# Patient Record
Sex: Male | Born: 1970 | Race: White | Hispanic: No | Marital: Single
Health system: Southern US, Community
[De-identification: ages and names within clinical notes are randomized; demographics above are authoritative.]

---

## 2003-11-12 ENCOUNTER — Emergency Department (HOSPITAL_COMMUNITY): Admission: EM | Admit: 2003-11-12 | Discharge: 2003-11-12 | Payer: Self-pay | Admitting: Emergency Medicine

## 2003-11-17 ENCOUNTER — Emergency Department (HOSPITAL_COMMUNITY): Admission: EM | Admit: 2003-11-17 | Discharge: 2003-11-17 | Payer: Self-pay | Admitting: Emergency Medicine

## 2003-11-17 ENCOUNTER — Emergency Department (HOSPITAL_COMMUNITY): Admission: AD | Admit: 2003-11-17 | Discharge: 2003-11-17 | Payer: Self-pay | Admitting: Family Medicine

## 2004-05-28 ENCOUNTER — Emergency Department (HOSPITAL_COMMUNITY): Admission: EM | Admit: 2004-05-28 | Discharge: 2004-05-28 | Payer: Self-pay | Admitting: Emergency Medicine

## 2004-05-28 ENCOUNTER — Emergency Department (HOSPITAL_COMMUNITY): Admission: EM | Admit: 2004-05-28 | Discharge: 2004-05-28 | Payer: Self-pay | Admitting: Family Medicine

## 2005-04-09 ENCOUNTER — Emergency Department (HOSPITAL_COMMUNITY): Admission: EM | Admit: 2005-04-09 | Discharge: 2005-04-09 | Payer: Self-pay | Admitting: Emergency Medicine

## 2005-12-06 ENCOUNTER — Emergency Department (HOSPITAL_COMMUNITY): Admission: EM | Admit: 2005-12-06 | Discharge: 2005-12-06 | Payer: Self-pay | Admitting: Emergency Medicine

## 2006-01-31 ENCOUNTER — Observation Stay (HOSPITAL_COMMUNITY): Admission: EM | Admit: 2006-01-31 | Discharge: 2006-01-31 | Payer: Self-pay | Admitting: Emergency Medicine

## 2006-02-02 ENCOUNTER — Emergency Department (HOSPITAL_COMMUNITY): Admission: EM | Admit: 2006-02-02 | Discharge: 2006-02-02 | Payer: Self-pay | Admitting: Emergency Medicine

## 2006-02-03 ENCOUNTER — Emergency Department (HOSPITAL_COMMUNITY): Admission: EM | Admit: 2006-02-03 | Discharge: 2006-02-03 | Payer: Self-pay | Admitting: Emergency Medicine

## 2006-02-25 ENCOUNTER — Ambulatory Visit: Payer: Self-pay | Admitting: *Deleted

## 2006-02-26 ENCOUNTER — Ambulatory Visit (HOSPITAL_COMMUNITY): Admission: RE | Admit: 2006-02-26 | Discharge: 2006-02-26 | Payer: Self-pay | Admitting: Otolaryngology

## 2006-03-03 ENCOUNTER — Ambulatory Visit (HOSPITAL_BASED_OUTPATIENT_CLINIC_OR_DEPARTMENT_OTHER): Admission: RE | Admit: 2006-03-03 | Discharge: 2006-03-03 | Payer: Self-pay | Admitting: Surgery

## 2006-03-11 ENCOUNTER — Emergency Department (HOSPITAL_COMMUNITY): Admission: EM | Admit: 2006-03-11 | Discharge: 2006-03-11 | Payer: Self-pay | Admitting: Emergency Medicine

## 2006-12-26 IMAGING — CT CT MAXILLOFACIAL W/O CM
1 of 2 series · 15 of 30 positions shown, 19 images · non-contrast
Comparison: 01/30/06.
 MAXILLOFACIAL CT WITHOUT CONTRAST:

CLINICAL DATA: Recent jaw surgery for fracture.  Continued pain and swelling.
TECHNIQUE: Axial and coronal CT imaging was performed through the maxillofacial structures.  No intravenous contrast was administered.

[Series 2: supine facial bones · axial · 0.33mm/px · z∈[+29,+169]mm · 15 of 66 slices shown, 19 images]
[im 5/66  brain]
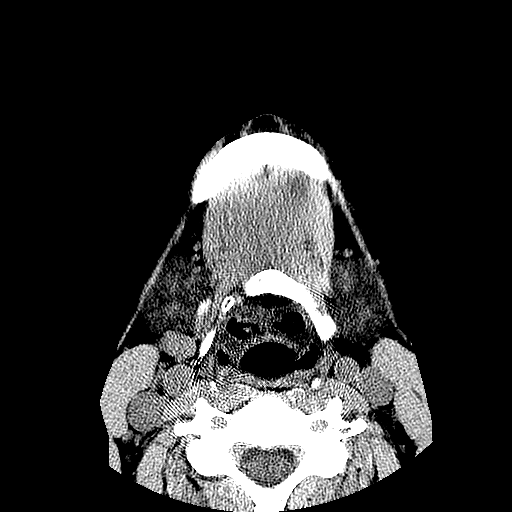
[im 5/66  bone]
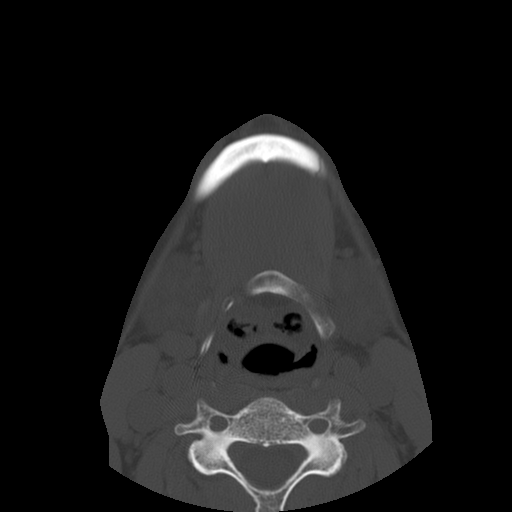
[im 9/66  bone]
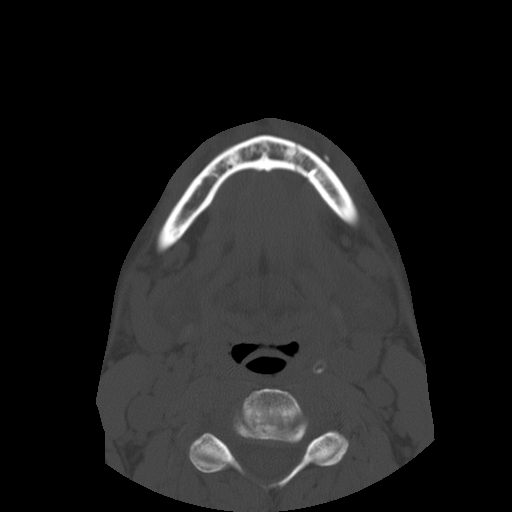
[im 13/66  bone]
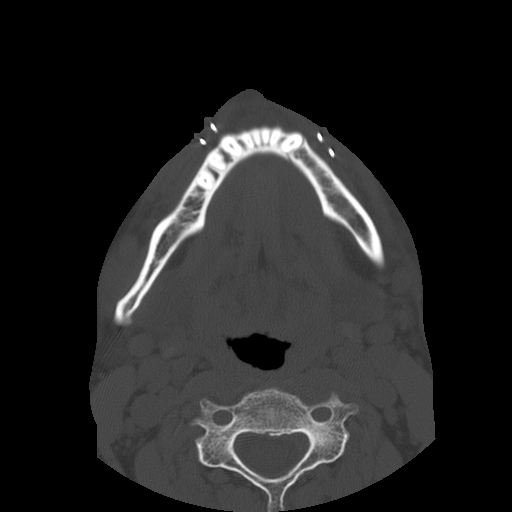
[im 17/66  bone]
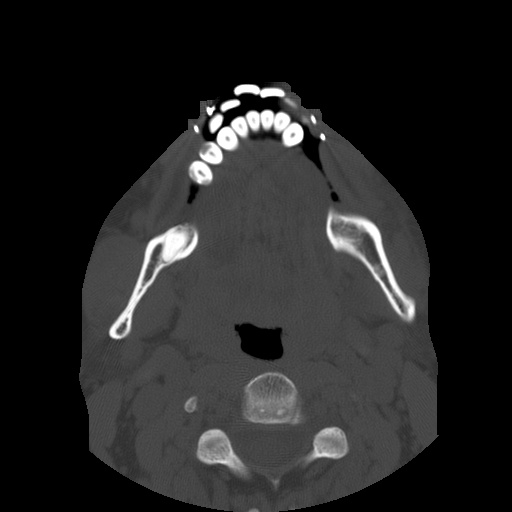
[im 21/66  brain]
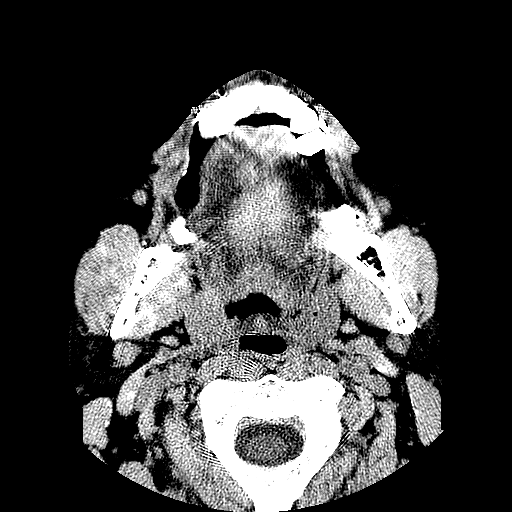
[im 21/66  bone]
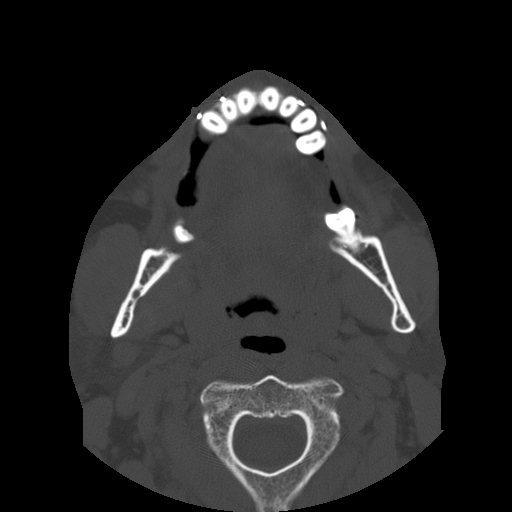
[im 25/66  bone]
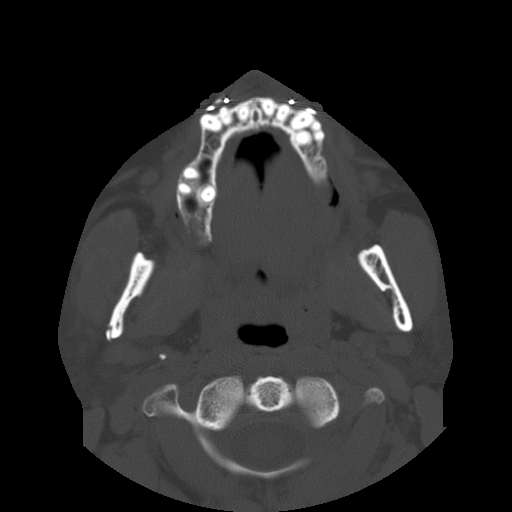
[im 29/66  bone]
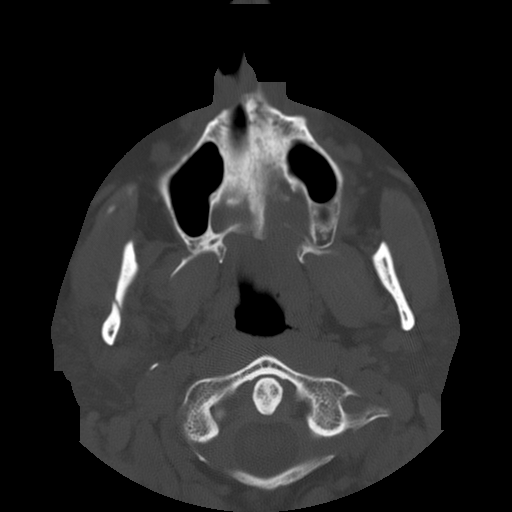
[im 33/66  bone]
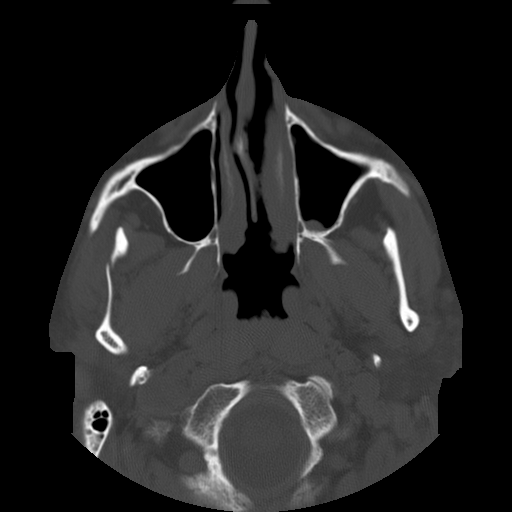
[im 37/66  brain]
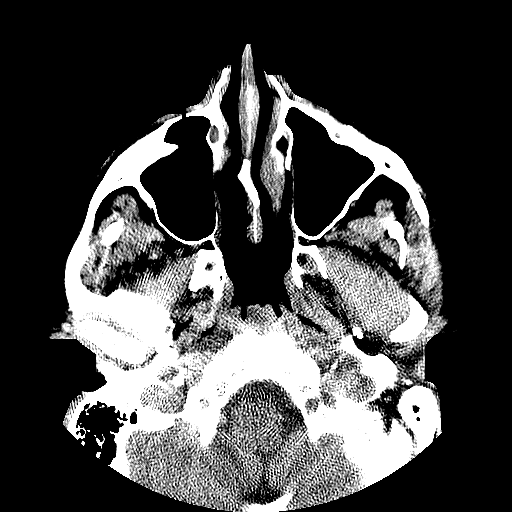
[im 37/66  bone]
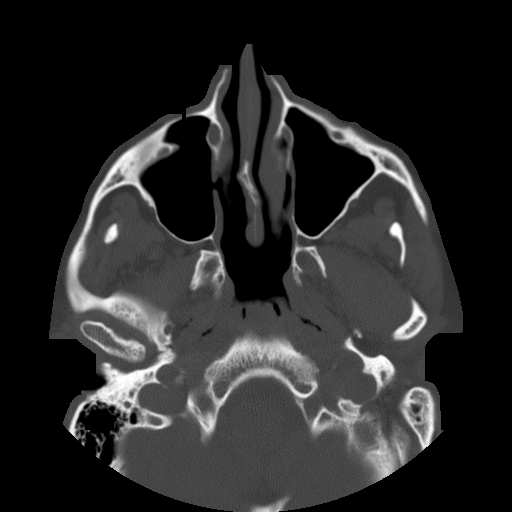
[im 41/66  bone]
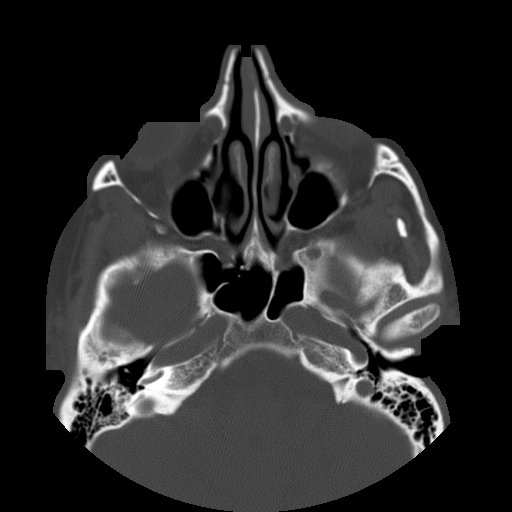
[im 45/66  bone]
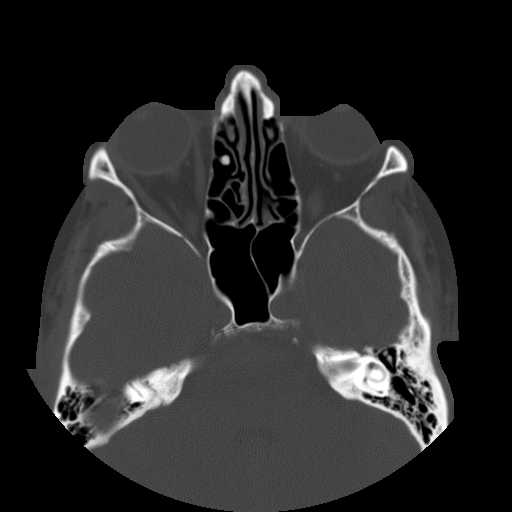
[im 49/66  bone]
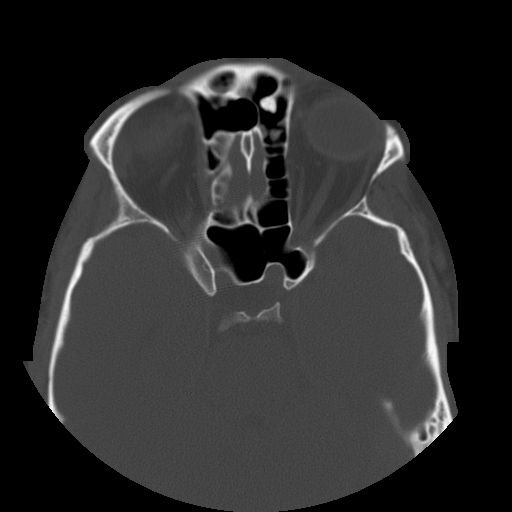
[im 53/66  brain]
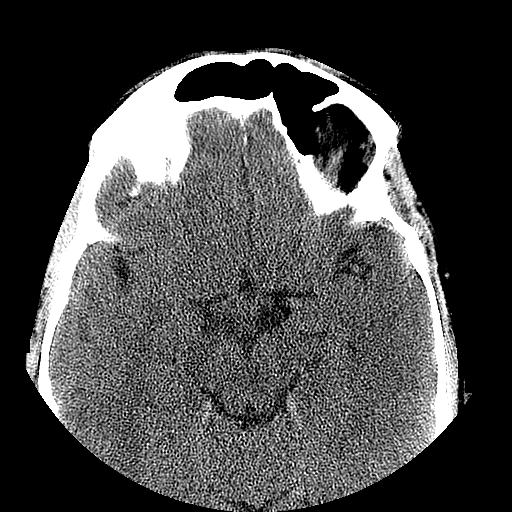
[im 53/66  bone]
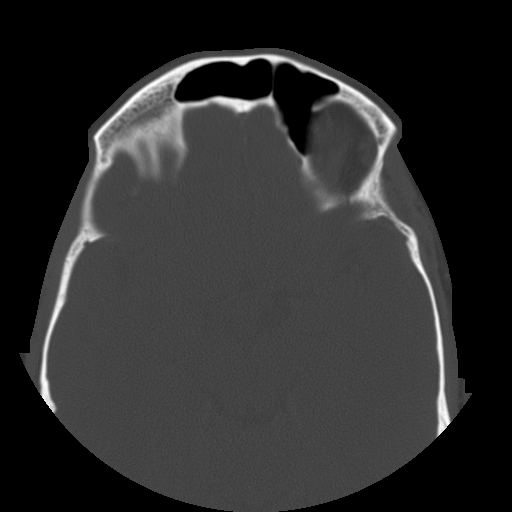
[im 57/66  bone]
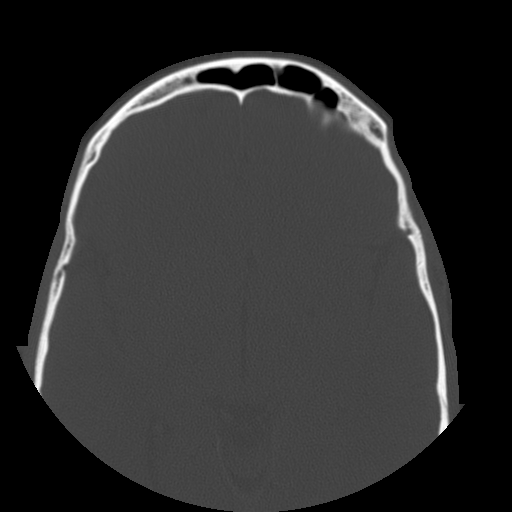
[im 61/66  bone]
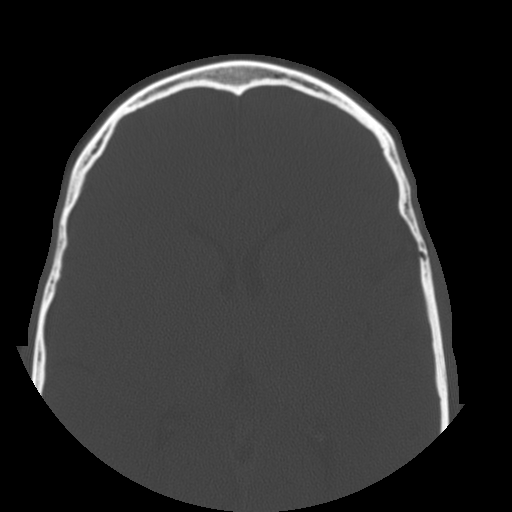

[15 of 30 positions shown; findings below may reference images not displayed]

FINDINGS: The patient has undergone dental occlusion.  Left parasymphyseal minimally   displaced fracture and right subcondylar mandibular fracture are again noted.  No additional fractures.  There are small lymph nodes in the neck.  No abnormal fluid collections.  A retention cyst or polyp is seen in the left maxillary sinus.  No air-fluid levels.  Mandibular condyles are located.
IMPRESSION: 1.  Status post dental occlusion for left parasymphyseal and right subcondylar mandibular fractures.

## 2008-10-11 ENCOUNTER — Emergency Department (HOSPITAL_COMMUNITY): Admission: EM | Admit: 2008-10-11 | Discharge: 2008-10-11 | Payer: Self-pay | Admitting: Emergency Medicine

## 2009-01-12 ENCOUNTER — Emergency Department (HOSPITAL_COMMUNITY): Admission: EM | Admit: 2009-01-12 | Discharge: 2009-01-12 | Payer: Self-pay | Admitting: Emergency Medicine

## 2009-01-13 ENCOUNTER — Emergency Department (HOSPITAL_COMMUNITY): Admission: EM | Admit: 2009-01-13 | Discharge: 2009-01-13 | Payer: Self-pay | Admitting: Emergency Medicine

## 2009-03-10 ENCOUNTER — Emergency Department (HOSPITAL_COMMUNITY): Admission: EM | Admit: 2009-03-10 | Discharge: 2009-03-10 | Payer: Self-pay | Admitting: Family Medicine

## 2009-03-12 ENCOUNTER — Emergency Department (HOSPITAL_COMMUNITY): Admission: EM | Admit: 2009-03-12 | Discharge: 2009-03-12 | Payer: Self-pay | Admitting: Family Medicine

## 2009-05-16 ENCOUNTER — Emergency Department (HOSPITAL_COMMUNITY): Admission: EM | Admit: 2009-05-16 | Discharge: 2009-05-16 | Payer: Self-pay | Admitting: Family Medicine

## 2010-01-18 ENCOUNTER — Emergency Department (HOSPITAL_COMMUNITY)
Admission: EM | Admit: 2010-01-18 | Discharge: 2010-01-18 | Payer: Self-pay | Source: Home / Self Care | Admitting: Emergency Medicine

## 2010-12-21 ENCOUNTER — Emergency Department (HOSPITAL_COMMUNITY)
Admission: EM | Admit: 2010-12-21 | Discharge: 2010-12-21 | Payer: Self-pay | Source: Home / Self Care | Admitting: Emergency Medicine

## 2010-12-28 ENCOUNTER — Emergency Department (HOSPITAL_COMMUNITY)
Admission: EM | Admit: 2010-12-28 | Discharge: 2010-12-28 | Payer: Self-pay | Source: Home / Self Care | Admitting: Emergency Medicine

## 2010-12-31 LAB — URIC ACID: Uric Acid, Serum: 4.5 mg/dL (ref 4.0–7.8)

## 2011-01-05 ENCOUNTER — Ambulatory Visit (HOSPITAL_COMMUNITY)
Admission: RE | Admit: 2011-01-05 | Discharge: 2011-01-05 | Payer: Self-pay | Source: Home / Self Care | Attending: Specialist | Admitting: Specialist

## 2011-01-20 ENCOUNTER — Emergency Department (HOSPITAL_COMMUNITY)
Admission: EM | Admit: 2011-01-20 | Discharge: 2011-01-20 | Disposition: A | Discharge status: Home / Self Care | Payer: No Typology Code available for payment source | Attending: Emergency Medicine | Admitting: Emergency Medicine

## 2011-01-20 ENCOUNTER — Emergency Department (HOSPITAL_COMMUNITY): Payer: No Typology Code available for payment source

## 2011-01-20 DIAGNOSIS — M25569 Pain in unspecified knee: Secondary | ICD-10-CM | POA: Insufficient documentation

## 2011-01-20 DIAGNOSIS — M79609 Pain in unspecified limb: Secondary | ICD-10-CM | POA: Insufficient documentation

## 2011-02-28 ENCOUNTER — Ambulatory Visit (HOSPITAL_COMMUNITY)
Admission: RE | Admit: 2011-02-28 | Discharge: 2011-02-28 | Disposition: A | Discharge status: Home / Self Care | Payer: No Typology Code available for payment source | Source: Ambulatory Visit | Attending: Specialist | Admitting: Specialist

## 2011-02-28 ENCOUNTER — Encounter (HOSPITAL_COMMUNITY)
Admission: RE | Admit: 2011-02-28 | Discharge: 2011-02-28 | Disposition: A | Discharge status: Home / Self Care | Payer: No Typology Code available for payment source | Source: Ambulatory Visit | Attending: Specialist | Admitting: Specialist

## 2011-02-28 ENCOUNTER — Other Ambulatory Visit (HOSPITAL_COMMUNITY): Payer: Self-pay | Admitting: Specialist

## 2011-02-28 DIAGNOSIS — Z01812 Encounter for preprocedural laboratory examination: Secondary | ICD-10-CM | POA: Insufficient documentation

## 2011-02-28 DIAGNOSIS — Z87891 Personal history of nicotine dependence: Secondary | ICD-10-CM | POA: Insufficient documentation

## 2011-02-28 DIAGNOSIS — M48061 Spinal stenosis, lumbar region without neurogenic claudication: Secondary | ICD-10-CM

## 2011-02-28 DIAGNOSIS — M5126 Other intervertebral disc displacement, lumbar region: Secondary | ICD-10-CM | POA: Insufficient documentation

## 2011-02-28 DIAGNOSIS — Z01818 Encounter for other preprocedural examination: Secondary | ICD-10-CM | POA: Insufficient documentation

## 2011-02-28 DIAGNOSIS — Z01810 Encounter for preprocedural cardiovascular examination: Secondary | ICD-10-CM | POA: Insufficient documentation

## 2011-02-28 LAB — DIFFERENTIAL
Basophils Absolute: 0 10*3/uL (ref 0.0–0.1)
Lymphocytes Relative: 33 % (ref 12–46)
Lymphs Abs: 2.7 10*3/uL (ref 0.7–4.0)
Monocytes Absolute: 0.5 10*3/uL (ref 0.1–1.0)
Neutro Abs: 4.6 10*3/uL (ref 1.7–7.7)

## 2011-02-28 LAB — COMPREHENSIVE METABOLIC PANEL
ALT: 27 U/L (ref 0–53)
AST: 24 U/L (ref 0–37)
Albumin: 3.8 g/dL (ref 3.5–5.2)
Alkaline Phosphatase: 94 U/L (ref 39–117)
Chloride: 102 mEq/L (ref 96–112)
GFR calc Af Amer: >60 mL/min (ref >60)
Potassium: 4.6 mEq/L (ref 3.5–5.1)
Total Bilirubin: 0.4 mg/dL (ref 0.3–1.2)

## 2011-02-28 LAB — CBC
HCT: 40 % (ref 39.0–52.0)
Hemoglobin: 13 g/dL (ref 13.0–17.0)
MCV: 83.2 fL (ref 78.0–100.0)
WBC: 8.1 10*3/uL (ref 4.0–10.5)

## 2011-02-28 LAB — PROTIME-INR: INR: 0.91 (ref 0.00–1.49)

## 2011-03-04 ENCOUNTER — Observation Stay (HOSPITAL_COMMUNITY)
Admission: RE | Admit: 2011-03-04 | Discharge: 2011-03-06 | Disposition: A | Discharge status: Home / Self Care | Payer: No Typology Code available for payment source | Source: Ambulatory Visit | Attending: Specialist | Admitting: Specialist

## 2011-03-04 ENCOUNTER — Ambulatory Visit (HOSPITAL_COMMUNITY): Payer: No Typology Code available for payment source

## 2011-03-04 DIAGNOSIS — F17200 Nicotine dependence, unspecified, uncomplicated: Secondary | ICD-10-CM | POA: Insufficient documentation

## 2011-03-04 DIAGNOSIS — M51379 Other intervertebral disc degeneration, lumbosacral region without mention of lumbar back pain or lower extremity pain: Secondary | ICD-10-CM | POA: Insufficient documentation

## 2011-03-04 DIAGNOSIS — M5137 Other intervertebral disc degeneration, lumbosacral region: Secondary | ICD-10-CM | POA: Insufficient documentation

## 2011-03-04 DIAGNOSIS — F411 Generalized anxiety disorder: Secondary | ICD-10-CM | POA: Insufficient documentation

## 2011-03-04 DIAGNOSIS — Z01812 Encounter for preprocedural laboratory examination: Secondary | ICD-10-CM | POA: Insufficient documentation

## 2011-03-04 DIAGNOSIS — K219 Gastro-esophageal reflux disease without esophagitis: Secondary | ICD-10-CM | POA: Insufficient documentation

## 2011-03-04 DIAGNOSIS — M5126 Other intervertebral disc displacement, lumbar region: Principal | ICD-10-CM | POA: Insufficient documentation

## 2011-03-04 DIAGNOSIS — Z01818 Encounter for other preprocedural examination: Secondary | ICD-10-CM | POA: Insufficient documentation
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
## 2011-03-28 ENCOUNTER — Other Ambulatory Visit (HOSPITAL_COMMUNITY): Payer: Self-pay | Admitting: Specialist

## 2011-03-28 DIAGNOSIS — M2548 Effusion, other site: Secondary | ICD-10-CM

## 2011-04-01 ENCOUNTER — Ambulatory Visit (HOSPITAL_COMMUNITY)
Admission: RE | Admit: 2011-04-01 | Discharge: 2011-04-01 | Disposition: A | Discharge status: Home / Self Care | Payer: No Typology Code available for payment source | Source: Ambulatory Visit | Attending: Specialist | Admitting: Specialist

## 2011-04-01 DIAGNOSIS — M25469 Effusion, unspecified knee: Secondary | ICD-10-CM | POA: Insufficient documentation

## 2011-04-01 DIAGNOSIS — M6281 Muscle weakness (generalized): Secondary | ICD-10-CM | POA: Insufficient documentation

## 2011-04-01 DIAGNOSIS — M7120 Synovial cyst of popliteal space [Baker], unspecified knee: Secondary | ICD-10-CM | POA: Insufficient documentation

## 2011-04-01 DIAGNOSIS — M2240 Chondromalacia patellae, unspecified knee: Secondary | ICD-10-CM | POA: Insufficient documentation

## 2011-04-01 DIAGNOSIS — M2548 Effusion, other site: Secondary | ICD-10-CM
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
## 2011-05-31 ENCOUNTER — Emergency Department (HOSPITAL_COMMUNITY)
Admission: EM | Admit: 2011-05-31 | Discharge: 2011-05-31 | Disposition: A | Discharge status: Home / Self Care | Payer: Self-pay | Attending: Emergency Medicine | Admitting: Emergency Medicine

## 2011-05-31 DIAGNOSIS — R5381 Other malaise: Secondary | ICD-10-CM | POA: Insufficient documentation

## 2011-05-31 DIAGNOSIS — R5383 Other fatigue: Secondary | ICD-10-CM | POA: Insufficient documentation

## 2011-05-31 DIAGNOSIS — M549 Dorsalgia, unspecified: Secondary | ICD-10-CM | POA: Insufficient documentation

## 2011-05-31 DIAGNOSIS — R110 Nausea: Secondary | ICD-10-CM | POA: Insufficient documentation

## 2011-05-31 DIAGNOSIS — G8929 Other chronic pain: Secondary | ICD-10-CM | POA: Insufficient documentation

## 2011-05-31 DIAGNOSIS — Z79899 Other long term (current) drug therapy: Secondary | ICD-10-CM | POA: Insufficient documentation

## 2011-11-20 IMAGING — CR DG RIBS W/ CHEST 3+V*R*
1 series · 1 of 1 positions shown · non-contrast
Comparison: None

CLINICAL DATA: Foot pain.  Posterior right rib pain.  Back pain.

RIGHT RIBS AND CHEST - 3+ VIEW

[t chest supine]
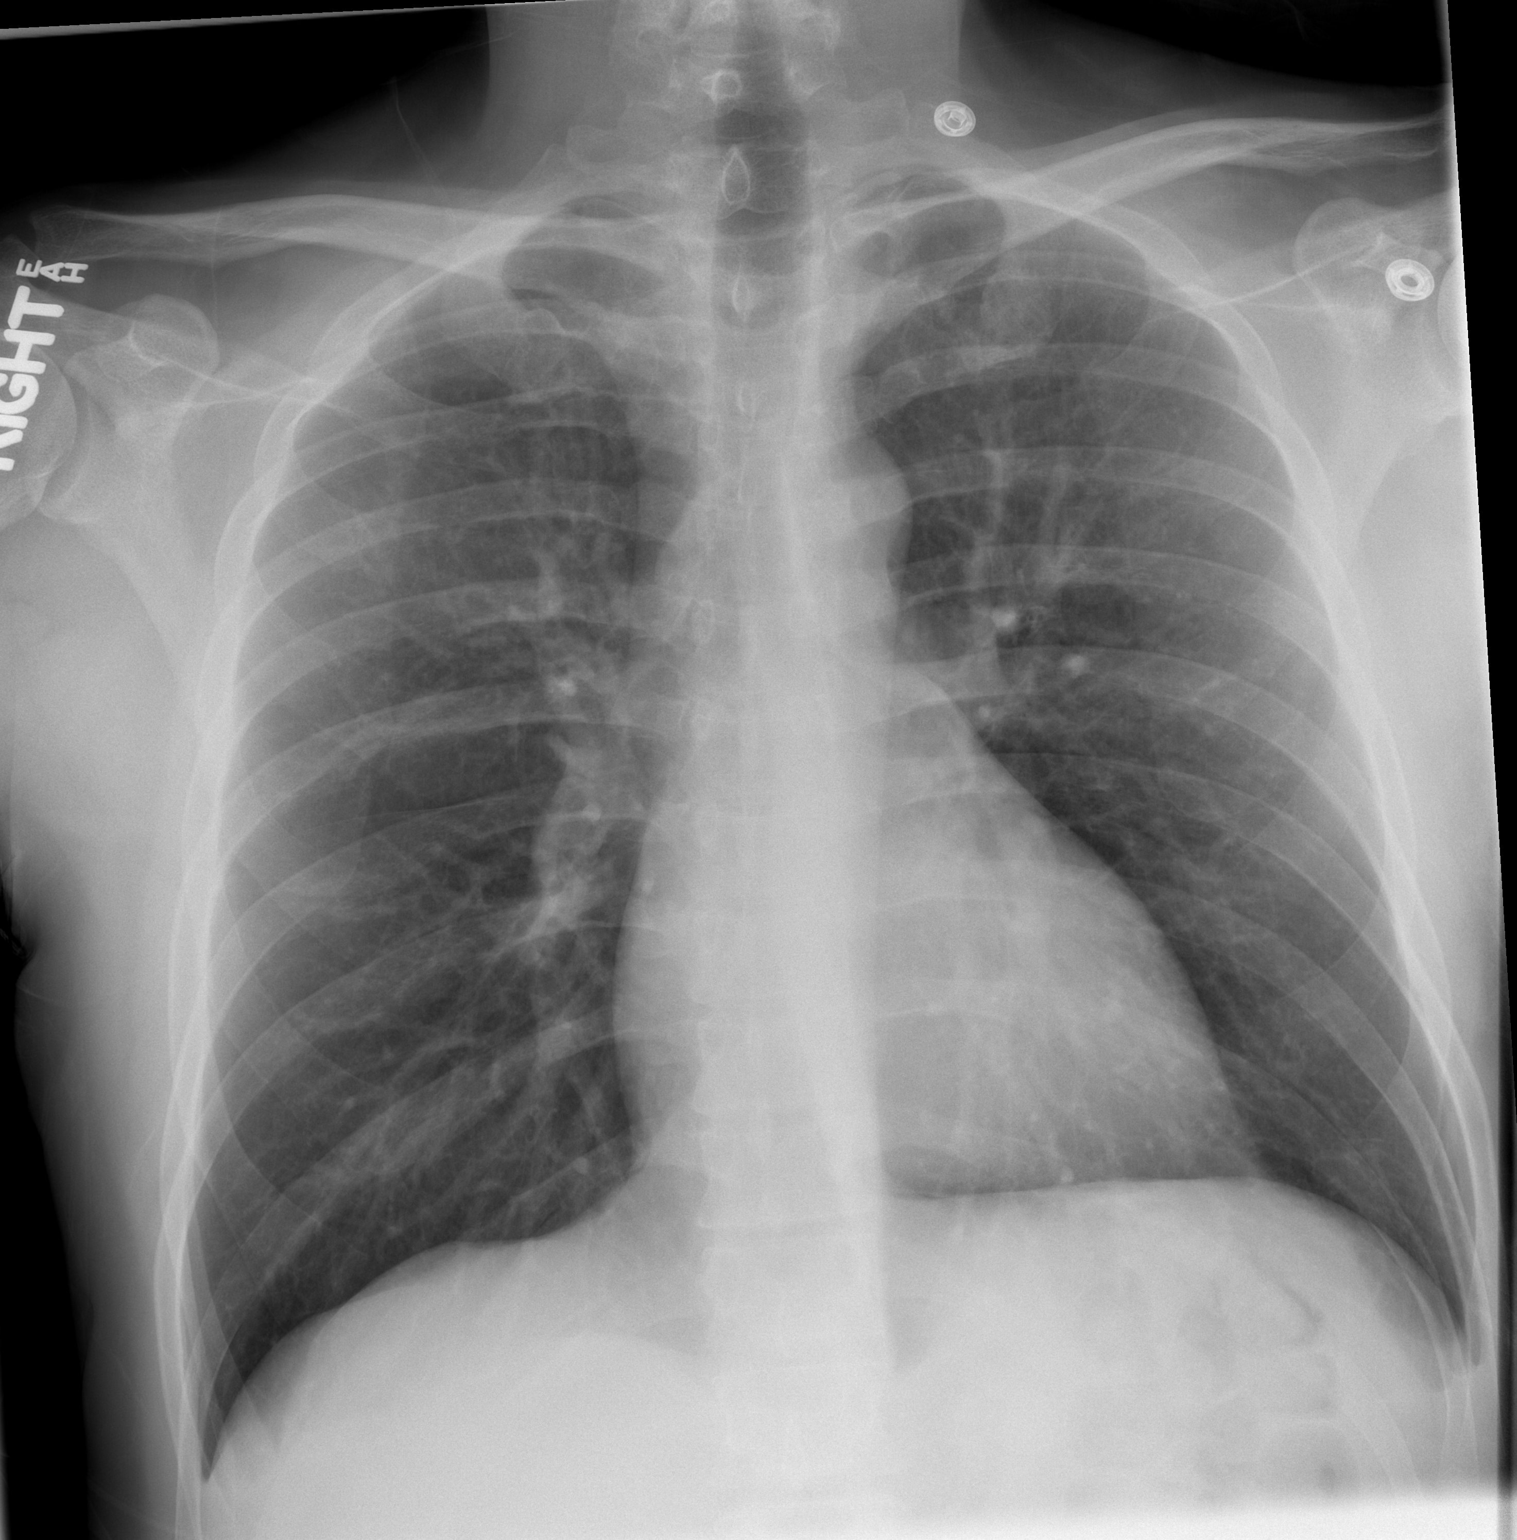

[1 of 1 positions shown; findings below may reference images not displayed]

FINDINGS: Cardiomediastinal silhouette is within normal limits.
The lungs are free of focal consolidations and pleural effusions.
Oblique views of the ribs show no acute fracture.  No evidence for
pneumothorax.  No evidence for vertebral fracture.
IMPRESSION: Negative exam.

## 2015-11-22 DIAGNOSIS — R634 Abnormal weight loss: Secondary | ICD-10-CM | POA: Insufficient documentation

## 2015-11-22 DIAGNOSIS — R002 Palpitations: Secondary | ICD-10-CM | POA: Insufficient documentation

## 2016-01-29 DIAGNOSIS — K009 Disorder of tooth development, unspecified: Secondary | ICD-10-CM

## 2016-01-29 NOTE — Congregational Nurse Program (Signed)
Congregational Nurse Program Note  Date of Encounter: 01/29/2016  Past Medical History: No past medical history on file.  Encounter Details:     CNP Questionnaire - 01/29/16 2353    Patient Demographics   Is this a new or existing patient? New   Patient is considered a/an Not Applicable   Race Caucasian/White   Patient Assistance   Location of Patient Assistance Not Applicable   Patient's financial/insurance status Medicaid   Uninsured Patient No   Patient referred to apply for the following financial assistance Not Applicable   Food insecurities addressed Not Applicable   Transportation assistance No   Assistance securing medications No   Educational health offerings Navigating the healthcare system   Encounter Details   Primary purpose of visit Education/Health Concerns;Spiritual Care/Support Visit;Navigating the Healthcare System   Was an Emergency Department visit averted? Not Applicable   Does patient have a medical provider? Yes   Patient referred to Follow up with established PCP   Was a mental health screening completed? (GAINS tool) No   Does patient have dental issues? Yes   Was a dental referral made? Yes   Does patient have vision issues? No   Since previous encounter, have you referred patient for abnormal blood pressure that resulted in a new diagnosis or medication change? No   Since previous encounter, have you referred patient for abnormal blood glucose that resulted in a new diagnosis or medication change? No   For Abstraction Use Only   Does patient have insurance? Yes     Client 's initial visit with nurse. In with his 69 year old  daughter . States I really need assistance with dental care . Nurse gave  client  The name of a dentist taking medicaid ,if he is  successful client needs to let  Nurse  Know  Or return for more assistance. This  dentist has done a presentation at  [facility] and is willing to  take clients. States  He has  A PCP  Dr. Jaci Lazier . Has been at  [facility] for 1 1/2 weeks , has transportation . Client was thankful  For assistance.

## 2016-02-13 DIAGNOSIS — K009 Disorder of tooth development, unspecified: Secondary | ICD-10-CM

## 2016-02-13 NOTE — Congregational Nurse Program (Signed)
Congregational Nurse Program Note  Date of Encounter: 02/13/2016  Past Medical History: No past medical history on file.  Encounter Details:     CNP Questionnaire - 02/13/16 1838    Patient Demographics   Is this a new or existing patient? Existing   Patient is considered a/an Not Applicable   Race Caucasian/White   Patient Assistance   Location of Patient Assistance Not Applicable   Patient's financial/insurance status Medicaid   Uninsured Patient No   Patient referred to apply for the following financial assistance Not Applicable   Food insecurities addressed Not Applicable   Transportation assistance No   Assistance securing medications No   Encounter Details   Primary purpose of visit Acute Illness/Condition Visit;Spiritual Care/Support Visit   Was an Emergency Department visit averted? Not Applicable   Does patient have a medical provider? Yes   Patient referred to Other (comment)   Was a mental health screening completed? (GAINS tool) No   Does patient have dental issues? No   Was a dental referral made? Yes   Does patient have vision issues? No   Since previous encounter, have you referred patient for abnormal blood pressure that resulted in a new diagnosis or medication change? No   Since previous encounter, have you referred patient for abnormal blood glucose that resulted in a new diagnosis or medication change? No   For Abstraction Use Only   Does patient have insurance? Yes    Client has  Not  Received a call back  For his  Dental needs . Nurse will try  Another provider and see if she  Can make the appointment. Client states he needs  Some  Teeth pulled  And is in pain. Client works  But  Is  Now  Concerned he lost his  Day  Care today  Due to  Funding  And isn't sure  What  He is going to  Be able to do . Has an appointment with his  Case manager to see what  Can be worked out.  States things  Just  Get harder. Nurse offered support and listened to client as he   Talked  About  Caring for  Daughter and having  No support system  Here.  Nurse will call client about  Dental appointment  When made.

## 2016-02-19 ENCOUNTER — Telehealth: Payer: Self-pay

## 2016-02-19 NOTE — Telephone Encounter (Signed)
Nurse  Made  Contact  With  Dental office  ,needed  Clients  Medicaid  Number  , nurse  Called  Client times  2 and left a message.

## 2016-02-26 ENCOUNTER — Telehealth: Payer: Self-pay

## 2016-02-26 DIAGNOSIS — K009 Disorder of tooth development, unspecified: Secondary | ICD-10-CM

## 2016-02-26 NOTE — Telephone Encounter (Signed)
Nurse called to  Schedule  Clients  Dental appointment ,states he couldn't get  Through. Appointment made for client 03-25-16 @ 2 pm .  Will  Let  Client know  Of  Appointment and need to  Take medicaid  Card and co pay  Of  $3.00.

## 2016-02-26 NOTE — Congregational Nurse Program (Signed)
Congregational Nurse Program Note  Date of Encounter: 02/26/2016  Past Medical History: No past medical history on file.  Encounter Details:     CNP Questionnaire - 02/26/16 2241    Patient Demographics   Is this a new or existing patient? Existing   Patient is considered a/an Not Applicable   Race Caucasian/White   Patient Assistance   Location of Patient Assistance Not Applicable   Patient's financial/insurance status Medicaid   Uninsured Patient No   Patient referred to apply for the following financial assistance Not Applicable   Food insecurities addressed Not Applicable   Transportation assistance No   Assistance securing medications No   Educational health offerings Navigating the healthcare system   Encounter Details   Primary purpose of visit Acute Illness/Condition Visit;Education/Health Concerns   Was an Emergency Department visit averted? Not Applicable   Does patient have a medical provider? Yes   Patient referred to Area Agency   Was a mental health screening completed? (GAINS tool) No   Does patient have dental issues? No   Was a dental referral made? Yes   Does patient have vision issues? No   Since previous encounter, have you referred patient for abnormal blood pressure that resulted in a new diagnosis or medication change? No   Since previous encounter, have you referred patient for abnormal blood glucose that resulted in a new diagnosis or medication change? No   For Abstraction Use Only   Does patient have insurance? Yes    Nurse  Was able to  Make  A dental appointment for client and told client appointment was made last week. Appointment made for 03-25-16 @ 2 pm  With  Dr. Laurann MontanaSharon Long- Stokes . Location and phone  Number given to  Client. He is to arrive  30 minutes in advance of appointment.  Note  Left in mail box .   Patient:   Procedure(s)   Anesthesia type  Post pain:   Post assessment:   Last Vitals:   Post vital signs:   Level of  consciousness:   Complications:

## 2016-03-11 DIAGNOSIS — K009 Disorder of tooth development, unspecified: Secondary | ICD-10-CM

## 2016-03-11 NOTE — Congregational Nurse Program (Signed)
Congregational Nurse Program Note  Date of Encounter: 03/11/2016  Past Medical History: No past medical history on file.  Encounter Details:     CNP Questionnaire - 03/11/16 1856    Patient Demographics   Is this a new or existing patient? Existing   Patient is considered a/an Not Applicable   Race Caucasian/White   Patient Assistance   Location of Patient Assistance Not Applicable   Patient's financial/insurance status Medicaid   Uninsured Patient No   Patient referred to apply for the following financial assistance Medicaid   Food insecurities addressed Not Applicable   Transportation assistance No   Assistance securing medications No   Educational health offerings Acute disease;Navigating the healthcare system   Encounter Details   Primary purpose of visit Education/Health Concerns;Acute Illness/Condition Visit   Was an Emergency Department visit averted? Not Applicable   Does patient have a medical provider? Yes   Patient referred to Area Agency   Was a mental health screening completed? (GAINS tool) No   Does patient have dental issues? No   Was a dental referral made? Yes   Does patient have vision issues? No   Since previous encounter, have you referred patient for abnormal blood pressure that resulted in a new diagnosis or medication change? No   Since previous encounter, have you referred patient for abnormal blood glucose that resulted in a new diagnosis or medication change? No   For Abstraction Use Only   Does patient have insurance? Yes    Client  Touching  Base  With  Nurse , states he is anxious  About  His  Dental appointment and will definitely  Be there. Ask if his  Medicaid  Card is  Current ,no  But  Plans to  Go  To DSS to correct it  Before ndental appointment. Discussed the importance as dental office will want  A current card.  Appointment is 03-25-16 .  Follow  As needed.

## 2016-03-25 DIAGNOSIS — K009 Disorder of tooth development, unspecified: Secondary | ICD-10-CM

## 2016-03-25 NOTE — Congregational Nurse Program (Signed)
Congregational Nurse Program Note  Date of Encounter: 03/25/2016  Past Medical History: No past medical history on file.  Encounter Details:     CNP Questionnaire - 03/25/16 2246    Patient Demographics   Is this a new or existing patient? Existing   Patient is considered a/an Not Applicable   Race Caucasian/White   Patient Assistance   Location of Patient Assistance Not Applicable   Patient's financial/insurance status Medicaid   Uninsured Patient No   Patient referred to apply for the following financial assistance Not Applicable   Encounter Details   Was an Emergency Department visit averted? Not Applicable   Does patient have a medical provider? Yes   Patient referred to Area Agency;Private Practice   Was a mental health screening completed? (GAINS tool) No   Does patient have dental issues? Yes   Was a dental referral made? Yes   Does patient have vision issues? No   Since previous encounter, have you referred patient for abnormal blood pressure that resulted in a new diagnosis or medication change? No   Since previous encounter, have you referred patient for abnormal blood glucose that resulted in a new diagnosis or medication change? No   For Abstraction Use Only   Does patient have insurance? Yes     Client had  His dental appointment  Today  And was in to  Thank the  Nurse  For her referral . States he  Has some infection and the dentist  Gave him two  Prescriptions that he will take  And then return and his top teeth will be pulled. He  Was relieved that he has finally been seen for dental pain that he had learned to cope with. Stated MD was very nice. Care under way .  Follow  As needed.

## 2016-05-27 DIAGNOSIS — Z5900 Homelessness unspecified: Secondary | ICD-10-CM

## 2016-05-27 DIAGNOSIS — G43519 Persistent migraine aura without cerebral infarction, intractable, without status migrainosus: Secondary | ICD-10-CM

## 2016-05-28 DIAGNOSIS — Z5900 Homelessness unspecified: Secondary | ICD-10-CM

## 2016-05-28 NOTE — Congregational Nurse Program (Signed)
Congregational Nurse Program Note  Date of Encounter: 05/28/2016  Past Medical History: No past medical history on file.  Encounter Details:     CNP Questionnaire - 05/28/16 1746    Patient Demographics   Is this a new or existing patient? Existing   Patient is considered a/an Not Applicable   Race Caucasian/White   Patient Assistance   Location of Patient Assistance Not Applicable   Patient's financial/insurance status Medicaid   Uninsured Patient No   Patient referred to apply for the following financial assistance Not Applicable   Food insecurities addressed Not Applicable   Transportation assistance No   Assistance securing medications No   Educational health offerings Spiritual care;Interpersonal relationships   Encounter Details   Primary purpose of visit Spiritual Care/Support Visit;Education/Health Concerns   Was an Emergency Department visit averted? Not Applicable   Does patient have a medical provider? Yes   Patient referred to Not Applicable   Was a mental health screening completed? (GAINS tool) No   Does patient have dental issues? No   Does patient have vision issues? No   Does your patient have an abnormal blood pressure today? No   Since previous encounter, have you referred patient for abnormal blood pressure that resulted in a new diagnosis or medication change? No   Does your patient have an abnormal blood glucose today? No   Since previous encounter, have you referred patient for abnormal blood glucose that resulted in a new diagnosis or medication change? No   Was there a life-saving intervention made? No    Client in today  To  Thank  Nurse  For  Assisting him  On yesterday . States  Today  Has  Been a better day , he  Has  Accomplished a lot of things  , feeling  Much  Better,.  Working  On his  Statisticianhone  Issues . Case management stated  He  Completed  An  Application  For  Work  With  [employer] , housing application taken . Made a lot of  Progress   Today.

## 2016-05-28 NOTE — Congregational Nurse Program (Signed)
Congregational Nurse Program Note  Date of Encounter: 05/27/2016  Past Medical History: No past medical history on file.  Encounter Details:     CNP Questionnaire - 05/28/16 1735    Patient Demographics   Is this a new or existing patient? Existing   Patient is considered a/an Not Applicable   Race Caucasian/White   Patient Assistance   Location of Patient Assistance Not Applicable   Patient's financial/insurance status Medicaid   Uninsured Patient No   Patient referred to apply for the following financial assistance Not Applicable   Food insecurities addressed Not Applicable   Transportation assistance No   Assistance securing medications No   Educational health offerings Spiritual care;Acute disease   Encounter Details   Primary purpose of visit Acute Illness/Condition Visit;Spiritual Care/Support Visit;Education/Health Concerns   Was an Emergency Department visit averted? Not Applicable   Does patient have a medical provider? Yes   Patient referred to Not Applicable   Was a mental health screening completed? (GAINS tool) No   Does patient have dental issues? No   Does patient have vision issues? No   Does your patient have an abnormal blood pressure today? No   Since previous encounter, have you referred patient for abnormal blood pressure that resulted in a new diagnosis or medication change? No   Does your patient have an abnormal blood glucose today? No   Since previous encounter, have you referred patient for abnormal blood glucose that resulted in a new diagnosis or medication change? No   Was there a life-saving intervention made? No    Client in to see nurse  With  complaints  Of  Migraine headache . States he is under a lot  Of  Stress and his  Head is  Really  Hurting, needs help with  Making a few  Calls  As his  Phone isn't  Working , nurse allowed client to  Use her  Phone  And office  Phone to  Relieve  His  agitation and stress. Client has young  Daughter and  [facility]  [facility]  [facility]  [facility] and he is worried  About  Placement ,his  Temporary  Job  And  The next steps . Has no support  System  And is a recovering  Addict. 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 Nurse listened and offered support  And encouragement to  Client ,will speak with  Case management regarding his  Stress level  Today . Client was able to  Make  Calls  And  Accepted  Encouragement     To keep pushing  Himself  To  Get things  Done and things  Would  Work for him . Spoke later with  Case management  Regarding clients  Stress level . Case management  Working with  Client  And trying to assure placement for  Family  Unit goes well.

## 2021-03-19 DIAGNOSIS — E782 Mixed hyperlipidemia: Secondary | ICD-10-CM | POA: Insufficient documentation

## 2021-03-30 LAB — EXTERNAL GENERIC LAB PROCEDURE: COLOGUARD: NEGATIVE

## 2022-02-12 ENCOUNTER — Other Ambulatory Visit: Payer: Self-pay

## 2022-02-12 ENCOUNTER — Ambulatory Visit: Payer: Medicaid Other | Admitting: Podiatry

## 2022-02-17 ENCOUNTER — Ambulatory Visit: Payer: Medicaid Other | Admitting: Podiatry

## 2022-03-24 ENCOUNTER — Ambulatory Visit: Payer: Medicaid Other | Admitting: Podiatry

## 2022-03-28 ENCOUNTER — Ambulatory Visit: Payer: Medicaid Other | Admitting: Podiatry

## 2022-04-08 ENCOUNTER — Ambulatory Visit: Payer: Medicaid Other | Admitting: Podiatry

## 2022-04-22 ENCOUNTER — Ambulatory Visit (INDEPENDENT_AMBULATORY_CARE_PROVIDER_SITE_OTHER): Payer: Medicaid Other | Admitting: Podiatry

## 2022-04-22 DIAGNOSIS — B351 Tinea unguium: Secondary | ICD-10-CM

## 2022-04-22 DIAGNOSIS — G43009 Migraine without aura, not intractable, without status migrainosus: Secondary | ICD-10-CM | POA: Insufficient documentation

## 2022-04-22 DIAGNOSIS — F1111 Opioid abuse, in remission: Secondary | ICD-10-CM | POA: Insufficient documentation

## 2022-04-22 DIAGNOSIS — K219 Gastro-esophageal reflux disease without esophagitis: Secondary | ICD-10-CM | POA: Insufficient documentation

## 2022-04-22 DIAGNOSIS — F410 Panic disorder [episodic paroxysmal anxiety] without agoraphobia: Secondary | ICD-10-CM | POA: Insufficient documentation

## 2022-04-22 MED ORDER — CICLOPIROX 8 % EX SOLN: Freq: Every day | CUTANEOUS | 0 refills | Status: AC

## 2022-04-22 MED ORDER — FLUCONAZOLE 150 MG PO TABS: 150.0000 mg | ORAL_TABLET | ORAL | 0 refills | Status: AC

## 2022-04-27 ENCOUNTER — Encounter: Payer: Self-pay | Admitting: Podiatry

## 2022-04-27 NOTE — Progress Notes (Signed)
  Subjective:  Patient ID: Kurt Henderson, male    DOB: 1971/06/29,  MRN: [id number]  Chief Complaint  Patient presents with   Nail Problem    Thick painful toenails    51 y.o. male presents with the above complaint. History confirmed with patient.  He has discoloration and thickening of his great toes bilateral some other toes are affected but these are the most problematic ones  Objective:  Physical Exam: warm, good capillary refill, no trophic changes or ulcerative lesions, normal DP and PT pulses, normal sensory exam, and onychomycosis.     Assessment:   1. Onychomycosis      Plan:  Patient was evaluated and treated and all questions answered.  Discussed etiology and treatment options of onychomycosis including oral topical and laser treatment.  He is interested in getting rid of it as effectively as possible.  I recommended pulsed dosing with fluconazole and ciclopirox, he was concerned about having to take terbinafine due to possibility of liver issues.  I will see him back in 6 months for follow-up after this.  Photographs were taken  Return in about 6 months (around 10/23/2022) for follow up after nail fungus treatment.

## 2022-04-28 ENCOUNTER — Telehealth: Payer: Self-pay | Admitting: Podiatry

## 2022-04-28 NOTE — Telephone Encounter (Signed)
Pt called asking for nurse to call him he had a medical question that he refused to tell me any information/details  about.   Please advise

## 2022-10-23 ENCOUNTER — Ambulatory Visit (INDEPENDENT_AMBULATORY_CARE_PROVIDER_SITE_OTHER): Payer: Medicaid Other | Admitting: Podiatry

## 2022-10-23 DIAGNOSIS — Z91199 Patient's noncompliance with other medical treatment and regimen due to unspecified reason: Secondary | ICD-10-CM

## 2022-10-27 ENCOUNTER — Encounter: Payer: Self-pay | Admitting: Podiatry

## 2022-10-27 NOTE — Progress Notes (Signed)
Patient was no-show for appointment today 

## 2023-11-18 ENCOUNTER — Telehealth: Payer: Self-pay | Admitting: Physical Medicine and Rehabilitation

## 2023-11-18 ENCOUNTER — Ambulatory Visit: Payer: MEDICAID | Admitting: Physical Medicine and Rehabilitation

## 2023-11-18 DIAGNOSIS — M47819 Spondylosis without myelopathy or radiculopathy, site unspecified: Secondary | ICD-10-CM

## 2023-11-18 DIAGNOSIS — M47816 Spondylosis without myelopathy or radiculopathy, lumbar region: Secondary | ICD-10-CM | POA: Diagnosis not present

## 2023-11-18 DIAGNOSIS — M545 Low back pain, unspecified: Secondary | ICD-10-CM | POA: Diagnosis not present

## 2023-11-18 DIAGNOSIS — G8929 Other chronic pain: Secondary | ICD-10-CM

## 2023-11-18 MED ORDER — MELOXICAM 15 MG PO TABS: 15.0000 mg | ORAL_TABLET | Freq: Every day | ORAL | 0 refills | Status: AC

## 2023-11-18 NOTE — Telephone Encounter (Signed)
I called patient this afternoon, he verbalizes frustration as we are not able to view recent lumbar x-rays from another facility. I placed order for lumbar MRI imaging, he is unhappy this imaging can't be done today. I did write patient tor Meloxicam to take while we are waiting on lumbar MRI imaging. Should he require the use of narcotics, best option would be referral to chronic pain management.

## 2023-11-18 NOTE — Progress Notes (Signed)
Kurt Henderson - 52 y.o. male MRN [id number]  Date of birth: [birth year]  Office Visit Note: Visit Date: 11/18/2023 PCP: Milus Banister, MD Referred by: Milus Banister, MD  Subjective: Chief Complaint  Patient presents with   Lower Back - Pain   HPI: Kurt Henderson is a 52 y.o. male who comes in today per the request of Dr. Dartha Lodge for evaluation of chronic, worsening and severe bilateral lower back pain, right greater than left. Also reports numbness to left leg. Previous patient of Dr. Vira Browns. Pain ongoing for several years, worsened over the past several weeks. His pain worsens with movement and activity, specifically bending and when moving from sitting to standing position, he describes pain as constant sore and aching sensation, currently rates as 10 out of 10. Some relief of pain with home exercise regimen, heating pad, rest and use of medications. Some relief of pain with Ibuprofen and Goody powder. Patient states he had lumbar x-rays on Monday, I am not able to see these images in [facility]. He was a patient of Dr. Otelia Sergeant several years ago, history of lumbar injections several years ago with some relief of pain. Patient currently works as Chief Financial Officer for [employer]. Patient denies focal weakness. No recent trauma or falls.   Patients course is complicated by history of substance abuse, he is currently taking Methadone that is managed by [facility].      Review of Systems  Musculoskeletal:  Positive for back pain.  Neurological:  Positive for tingling. Negative for weakness.  All other systems reviewed and are negative.  Otherwise per HPI.  Assessment & Plan: Visit Diagnoses:    ICD-10-CM   1. Chronic bilateral low back pain without sciatica  M54.50 MR LUMBAR SPINE WO CONTRAST   G89.29     2. Spondylosis without myelopathy or radiculopathy  M47.819 MR LUMBAR SPINE WO CONTRAST    3. Facet arthropathy, lumbar  M47.816 MR LUMBAR SPINE WO CONTRAST       Plan: Findings:   Chronic, worsening and severe bilateral lower back pain, right greater than left. Intermittent paresthesias to left lower extremity. Patient continues to have severe pain despite good conservative therapies such as home exercise regimen, rest and use of medications. Patients clinical presentation and exam are consistent with facet mediated pain. He does have pain when moving from sitting to standing position and with lumbar extension. We discussed treatment plan in detail today, next step is to place order for lumbar MRI imaging. Depending on results of MRI imaging we discussed possibility of lumbar facet joint injections. Should his symptoms declare are more radicular in nature we would consider lumbar epidural steroid injection. I discussed medication management, instructed patient to stop Ibuprofen/Good powder and prescribed short course of Meloxicam. I also feel patient would benefit from re-grouping with formal physical therapy. I encouraged patient to continue being active as tolerated. I will see him back for lumbar MRI review. No red flag symptoms noted upon exam today.     Meds & Orders:  Meds ordered this encounter  Medications   meloxicam (MOBIC) 15 MG tablet    Sig: Take 1 tablet (15 mg total) by mouth daily.    Dispense:  30 tablet    Refill:  0    Orders Placed This Encounter  Procedures   MR LUMBAR SPINE WO CONTRAST    Follow-up: No follow-ups on file.   Procedures: No procedures performed      Clinical History: No specialty comments available.  He has no history on file for tobacco use. No results for input(s): "HGBA1C", "LABURIC" in the last 8760 hours.  Objective:  VS:  HT:    WT:   BMI:     BP:   HR: bpm  TEMP: ( )  RESP:  Physical Exam Vitals and nursing note reviewed.  HENT:     Head: Normocephalic and atraumatic.     Right Ear: External ear normal.     Left Ear: External ear normal.     Nose: Nose normal.     Mouth/Throat:     Mouth: Mucous membranes  are moist.  Eyes:     Extraocular Movements: Extraocular movements intact.  Cardiovascular:     Rate and Rhythm: Normal rate.     Pulses: Normal pulses.  Pulmonary:     Effort: Pulmonary effort is normal.  Abdominal:     General: Abdomen is flat. There is no distension.  Musculoskeletal:        General: Tenderness present.     Cervical back: Normal range of motion.     Comments: Patient rises from seated position to standing without difficulty. Pain noted with facet loading and lumbar extension. 5/5 strength noted with bilateral hip flexion, knee flexion/extension, ankle dorsiflexion/plantarflexion and EHL. No clonus noted bilaterally. No pain upon palpation of greater trochanters. No pain with internal/external rotation of bilateral hips. Sensation intact bilaterally. Negative slump test bilaterally. Ambulates without aid, gait steady.     Skin:    General: Skin is warm and dry.     Capillary Refill: Capillary refill takes less than 2 seconds.  Neurological:     General: No focal deficit present.     Mental Status: He is alert and oriented to person, place, and time.  Psychiatric:        Mood and Affect: Mood normal.        Behavior: Behavior normal.     Ortho Exam  Imaging: No results found.  Past Medical/Family/Surgical/Social History: Medications & Allergies reviewed per EMR, new medications updated. 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 No family history on file. No past surgical history on file. Social History   Occupational History   Not on file  Tobacco Use   Smoking status: Not on file   Smokeless tobacco: Not on file  Substance and  Sexual Activity   Alcohol use: Not on file   Drug use: Not on file   Sexual activity: Not on file

## 2023-11-18 NOTE — Progress Notes (Signed)
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  He stated 2 weeks ago he was stuck bent over and unable to stand up.  It is worse on the R>L  He stated he thought it was his kidneys.  He takes meds and hot baths as needed.  The pain came on suddenly.  Nights are worse and with this rain it makes things even harder.

## 2023-11-18 NOTE — Telephone Encounter (Signed)
Patient called advised he can get his MRI until December 10, 2023. Patient said he will need something for pain called into his pharmacy. Patient said he is taking Prednisone prescribed by his PCP. Patient asked if he can get a sooner appointment for his MRI? Patient asked if someone could call him back. [phone]

## 2023-11-19 ENCOUNTER — Encounter: Payer: Self-pay | Admitting: Physical Medicine and Rehabilitation

## 2023-12-10 ENCOUNTER — Inpatient Hospital Stay: Admission: RE | Admit: 2023-12-10 | Payer: MEDICAID | Source: Ambulatory Visit

## 2023-12-13 ENCOUNTER — Ambulatory Visit
Admission: RE | Admit: 2023-12-13 | Discharge: 2023-12-13 | Disposition: A | Discharge status: Home / Self Care | Payer: MEDICAID | Source: Ambulatory Visit | Attending: Physical Medicine and Rehabilitation | Admitting: Physical Medicine and Rehabilitation

## 2023-12-13 DIAGNOSIS — M47816 Spondylosis without myelopathy or radiculopathy, lumbar region: Secondary | ICD-10-CM

## 2023-12-13 DIAGNOSIS — M545 Low back pain, unspecified: Secondary | ICD-10-CM

## 2023-12-13 DIAGNOSIS — M47819 Spondylosis without myelopathy or radiculopathy, site unspecified: Secondary | ICD-10-CM

## 2023-12-21 ENCOUNTER — Telehealth: Payer: Self-pay | Admitting: Physical Medicine and Rehabilitation

## 2023-12-21 NOTE — Telephone Encounter (Signed)
 Patient LVM very upset/ frustrated that he has not heard back from his MRI. He wants a call back regarding this. He does not have a follow up on schedule

## 2023-12-22 ENCOUNTER — Other Ambulatory Visit: Payer: Self-pay | Admitting: Physical Medicine and Rehabilitation

## 2023-12-22 DIAGNOSIS — G8929 Other chronic pain: Secondary | ICD-10-CM

## 2023-12-22 DIAGNOSIS — M545 Low back pain, unspecified: Secondary | ICD-10-CM

## 2023-12-22 MED ORDER — METHOCARBAMOL 500 MG PO TABS: 500.0000 mg | ORAL_TABLET | Freq: Three times a day (TID) | ORAL | 0 refills | Status: AC

## 2023-12-22 MED ORDER — METHOCARBAMOL 500 MG PO TABS: 500.0000 mg | ORAL_TABLET | Freq: Three times a day (TID) | ORAL | 0 refills | Status: DC

## 2023-12-24 ENCOUNTER — Ambulatory Visit (INDEPENDENT_AMBULATORY_CARE_PROVIDER_SITE_OTHER): Payer: MEDICAID | Admitting: Physical Medicine and Rehabilitation

## 2023-12-24 ENCOUNTER — Encounter: Payer: Self-pay | Admitting: Physical Medicine and Rehabilitation

## 2023-12-24 DIAGNOSIS — M5442 Lumbago with sciatica, left side: Secondary | ICD-10-CM | POA: Diagnosis not present

## 2023-12-24 DIAGNOSIS — M5416 Radiculopathy, lumbar region: Secondary | ICD-10-CM

## 2023-12-24 DIAGNOSIS — M48061 Spinal stenosis, lumbar region without neurogenic claudication: Secondary | ICD-10-CM | POA: Diagnosis not present

## 2023-12-24 DIAGNOSIS — M961 Postlaminectomy syndrome, not elsewhere classified: Secondary | ICD-10-CM | POA: Diagnosis not present

## 2023-12-24 DIAGNOSIS — G8929 Other chronic pain: Secondary | ICD-10-CM

## 2023-12-24 NOTE — Progress Notes (Signed)
Kurt Henderson - 53 y.o. male MRN [id number]  Date of birth: 1971-04-17  Office Visit Note: Visit Date: 12/24/2023 PCP: Milus Banister, MD Referred by: Milus Banister, MD  Subjective: Chief Complaint  Patient presents with   Other    Scan review--patient reports worsening pain and difficulty with ADL's   HPI: Kurt Henderson is a 53 y.o. male who comes in today for evaluation of chronic, worsening and severe bilateral lower back pain radiating down left buttock and down left lateral leg. He also reports numbness/tingling to left leg. Pain ongoing for several years. His pain worsens with movement and activity. He describes pain as sore and aching sensation, currently rates as 10 out of 10. Some relief of pain with home exercise regimen, heating pad, rest and use of medications. Some relief of pain with Ibuprofen and Goody powder. Patient reports history of lumbar injections with [facility] several years ago with significant relief of pain. Also reports left L5-S1 microdiscectomy with Dr. Otelia Sergeant on 03/04/2011. Patient currently works as Chief Financial Officer for [employer]. Patient denies focal weakness. No recent trauma or falls.    Patients course is complicated by history of substance abuse, he is currently taking Methadone that is managed by [facility]        Review of Systems  Musculoskeletal:  Positive for back pain.  Neurological:  Negative for tingling, sensory change, focal weakness and weakness.  All other systems reviewed and are negative.  Otherwise per HPI.  Assessment & Plan: Visit Diagnoses:    ICD-10-CM   1. Chronic bilateral low back pain with left-sided sciatica  M54.42 Ambulatory referral to Physical Medicine Rehab   G89.29     2. Lumbar radiculopathy  M54.16 Ambulatory referral to Physical Medicine Rehab    3. Foraminal stenosis of lumbar region  M48.061 Ambulatory referral to Physical Medicine Rehab    4. Post laminectomy syndrome  M96.1 Ambulatory referral to Physical Medicine  Rehab       Plan: Findings:  Chronic, worsening and severe bilateral lower back pain radiating down left buttock and down left lateral leg. Patient continues to have severe pain despite good conservative therapies such as home exercise regimen, rest and use of medications. Patients clinical presentation and exam are consistent with L5 nerve pattern. There is moderate-severe left foraminal stenosis at the level of L5-S1. We discussed treatment plan in detail today. Next step is to perform left L5 transforaminal epidural steroid injection under fluoroscopic guidance. I discussed injection procedure with him today in detail, he has no questions at this time. If good relief of pain with injection we can repeat this procedure infrequently as needed.   We had long conversation regarding medication management today. Patient is requesting narcotic pain medications. He does have history of substance abuse and is currently taking Methadone. I am not comfortable prescribing him narcotic pain medications and feel it would be best option would be referral for chronic pain management. I have prescribed meloxicam and robaxin for him recently and I did encourage him to try these medications. Patient is very adamate that he does not "need" narcotic pain medication. Should he change his mind, I am happy to place referral for chronic pain management. No red flag symptoms noted upon exam today. We will see him back for injection.      Meds & Orders: No orders of the defined types were placed in this encounter.   Orders Placed This Encounter  Procedures   Ambulatory referral to Physical Medicine Rehab  Follow-up: Return for Left L5 transforaminal epidural steroid injection.   Procedures: No procedures performed      Clinical History: Narrative & Impression CLINICAL DATA:  Low back pain for over 6 weeks   EXAM: MRI LUMBAR SPINE WITHOUT CONTRAST   TECHNIQUE: Multiplanar, multisequence MR imaging of the  lumbar spine was performed. No intravenous contrast was administered.   COMPARISON:  01/05/2011   FINDINGS: Segmentation:  Standard.   Alignment:  Physiologic.   Vertebrae: No acute fracture, evidence of discitis, or aggressive bone lesion.   Conus medullaris and cauda equina: Conus extends to the T12-L1 level. Conus and cauda equina appear normal.   Paraspinal and other soft tissues: No acute paraspinal abnormality.   Disc levels:   Disc spaces: Moderate disc height loss at L1-2 and L2-3 with Modic 2 endplate changes. Mild disc height loss at L3-4 and L4-5. Moderate-severe disc height loss at L5-S1 with Modic 1 endplate changes.   T11-12: Mild broad-based disc bulge. No foraminal or central canal stenosis.   T12-L1: No significant disc bulge. No neural foraminal stenosis. No central canal stenosis.   L1-L2: Mild broad-based disc bulge. No foraminal or central canal stenosis.   L2-L3: Moderate disc bulge. Mild bilateral facet arthropathy. Bilateral subarticular recess stenosis. Mild bilateral foraminal stenosis. No central canal stenosis.   L3-L4: Mild disc bulge. Mild bilateral facet arthropathy. Moderate bilateral foraminal stenosis. No central canal stenosis.   L4-L5: Mild disc bulge. Mild bilateral facet arthropathy. No central canal stenosis. Moderate bilateral foraminal stenosis.   L5-S1: Mild disc bulge. Mild bilateral facet arthropathy. Moderate right and moderate-severe left foraminal stenosis. No central canal stenosis.   IMPRESSION: 1. Diffuse lumbar spine spondylosis as described above. 2. At L5-S1 there is moderate right and moderate-severe left foraminal stenosis encroaching on the L5 nerve roots. 3. At L3-4 there is moderate bilateral foraminal stenosis encroaching on the L3 nerve root. 4. At L4-5 there is moderate bilateral foraminal stenosis encroaching on the L4 nerve roots. 5. No acute osseous injury of the lumbar spine.     Electronically  Signed   By: Elige Ko M.D.   On: 12/21/2023 12:06   He reports that he has been smoking cigarettes. He has never used smokeless tobacco. No results for input(s): "HGBA1C", "LABURIC" in the last 8760 hours.  Objective:  VS:  HT:    WT:   BMI:     BP:   HR: bpm  TEMP: ( )  RESP:  Physical Exam Vitals and nursing note reviewed.  HENT:     Head: Normocephalic and atraumatic.     Right Ear: External ear normal.     Left Ear: External ear normal.     Nose: Nose normal.     Mouth/Throat:     Mouth: Mucous membranes are moist.  Eyes:     Extraocular Movements: Extraocular movements intact.  Cardiovascular:     Rate and Rhythm: Normal rate.     Pulses: Normal pulses.  Pulmonary:     Effort: Pulmonary effort is normal.  Abdominal:     General: Abdomen is flat. There is no distension.  Musculoskeletal:        General: Tenderness present.     Cervical back: Normal range of motion.     Comments: Patient rises from seated position to standing without difficulty. Good lumbar range of motion. No pain noted with facet loading. 5/5 strength noted with bilateral hip flexion, knee flexion/extension, ankle dorsiflexion/plantarflexion and EHL. No clonus noted bilaterally. No pain  upon palpation of greater trochanters. No pain with internal/external rotation of bilateral hips. Sensation intact bilaterally. Dysesthesias noted to left L5 dermatome. Negative slump test bilaterally. Ambulates without aid, gait steady.     Skin:    General: Skin is warm and dry.     Capillary Refill: Capillary refill takes less than 2 seconds.  Neurological:     General: No focal deficit present.     Mental Status: He is alert and oriented to person, place, and time.  Psychiatric:        Mood and Affect: Mood normal.        Behavior: Behavior normal.     Ortho Exam  Imaging: No results found.  Past Medical/Family/Surgical/Social History: Medications & Allergies reviewed per EMR, new medications  updated. 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 No pertinent past medical history. History reviewed. No pertinent family history. History reviewed. No pertinent surgical history. Social History   Occupational History   Not on file  Tobacco Use   Smoking status: Every Day    Types: Cigarettes   Smokeless tobacco: Never  Substance and Sexual Activity   Alcohol use: Not on file   Drug use: Not on file   Sexual activity: Not on file
